# Patient Record
Sex: Female | Born: 1995 | Race: White | Hispanic: No | Marital: Single | Smoking: Never smoker
Health system: Southern US, Community
[De-identification: ages and names within clinical notes are randomized; demographics above are authoritative.]

## PROBLEM LIST (undated history)

## (undated) DIAGNOSIS — R51 Headache: Secondary | ICD-10-CM

## (undated) DIAGNOSIS — R519 Headache, unspecified: Secondary | ICD-10-CM

## (undated) DIAGNOSIS — G43909 Migraine, unspecified, not intractable, without status migrainosus: Secondary | ICD-10-CM

## (undated) HISTORY — PX: TONSILLECTOMY AND ADENOIDECTOMY: SUR1326

---
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
## 2015-05-05 IMAGING — CR DG FOOT COMPLETE 3+V*R*
3 series · 3 of 3 positions shown · non-contrast
Comparison: None.

CLINICAL DATA: Dropped heavy object across top of foot with pain

EXAM:
RIGHT FOOT COMPLETE - 3+ VIEW

[t foot ap right]
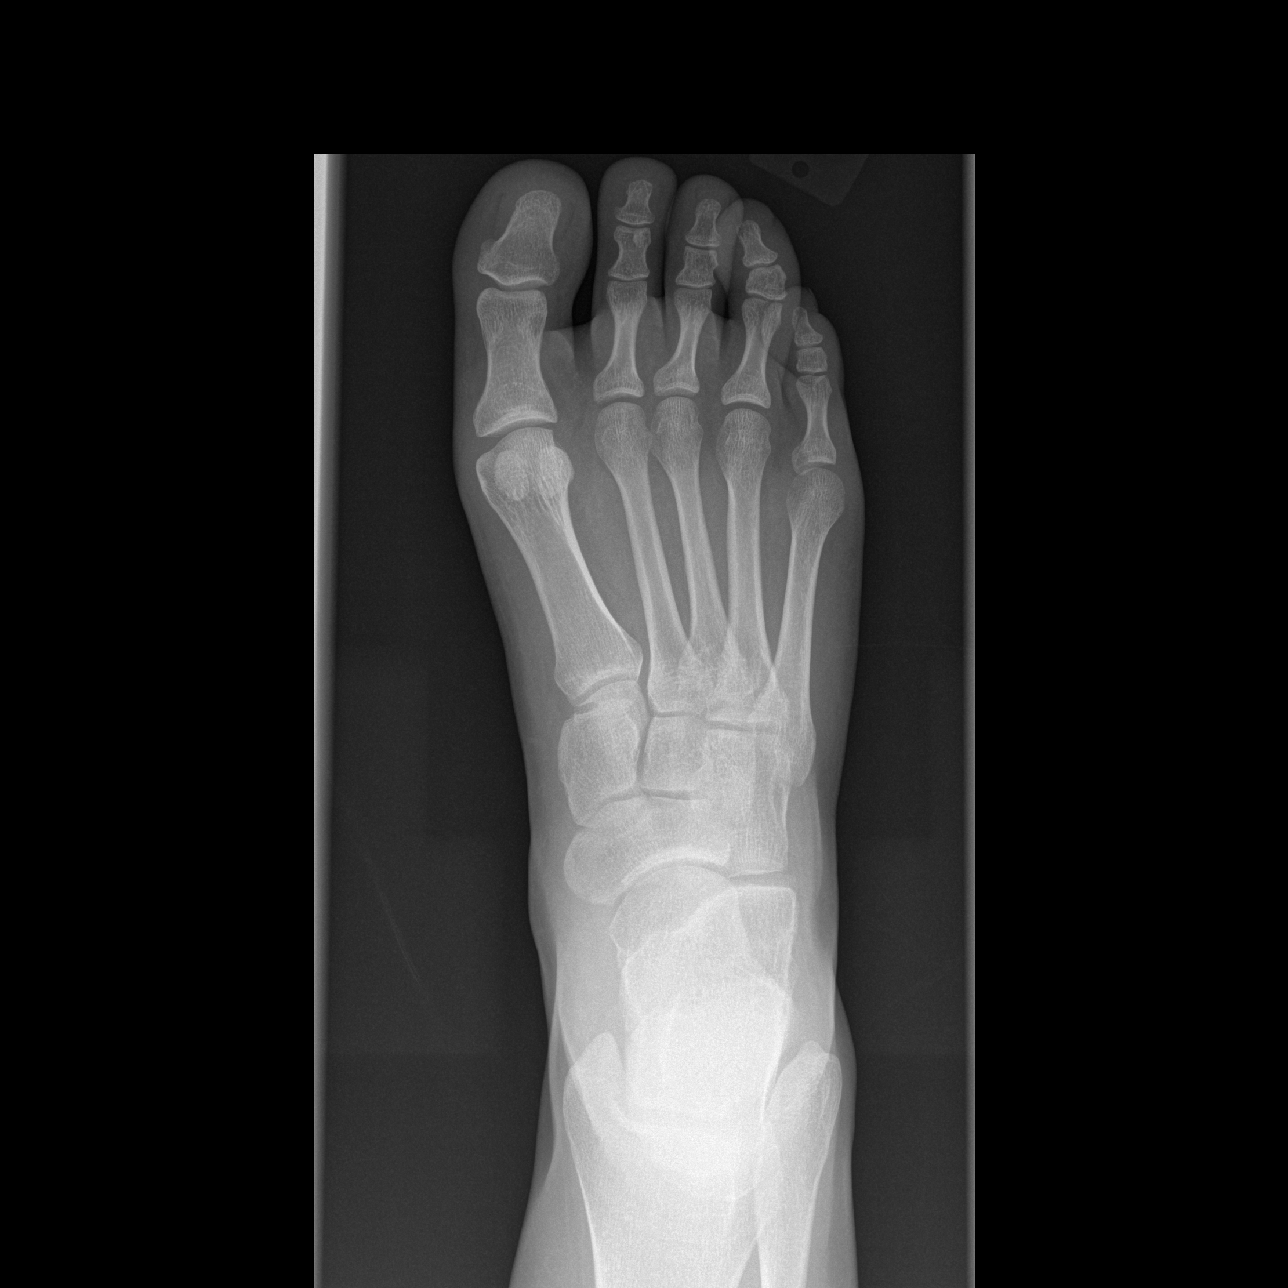

[t foot oblique right]
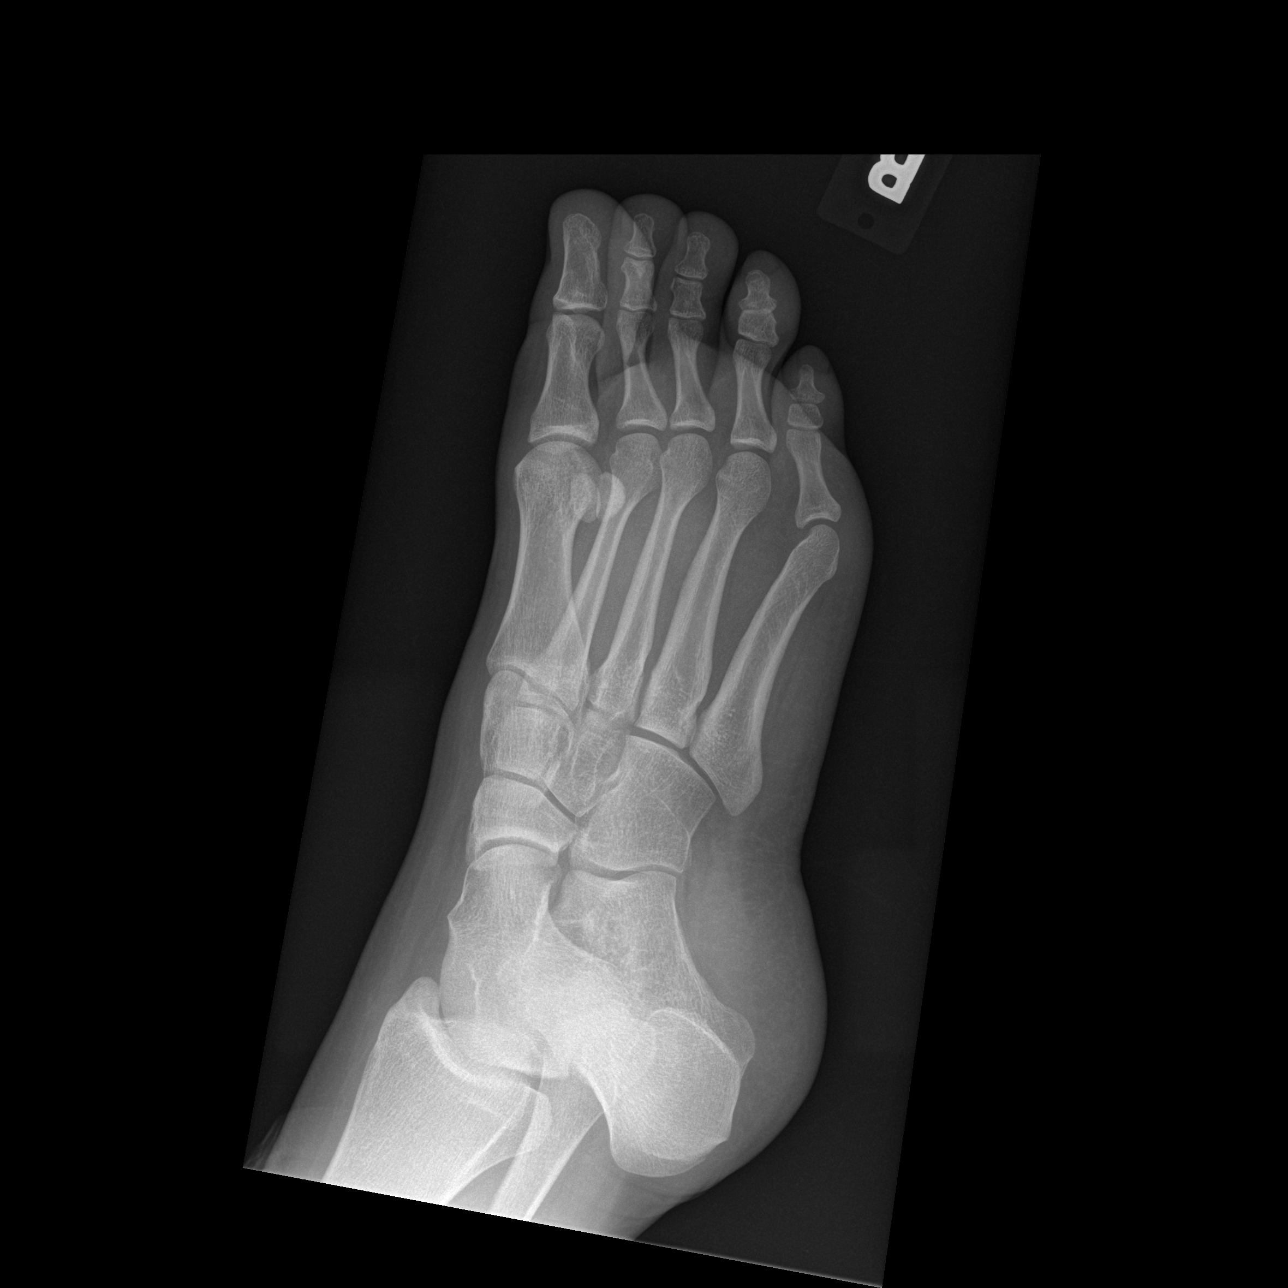

[t foot lat right]
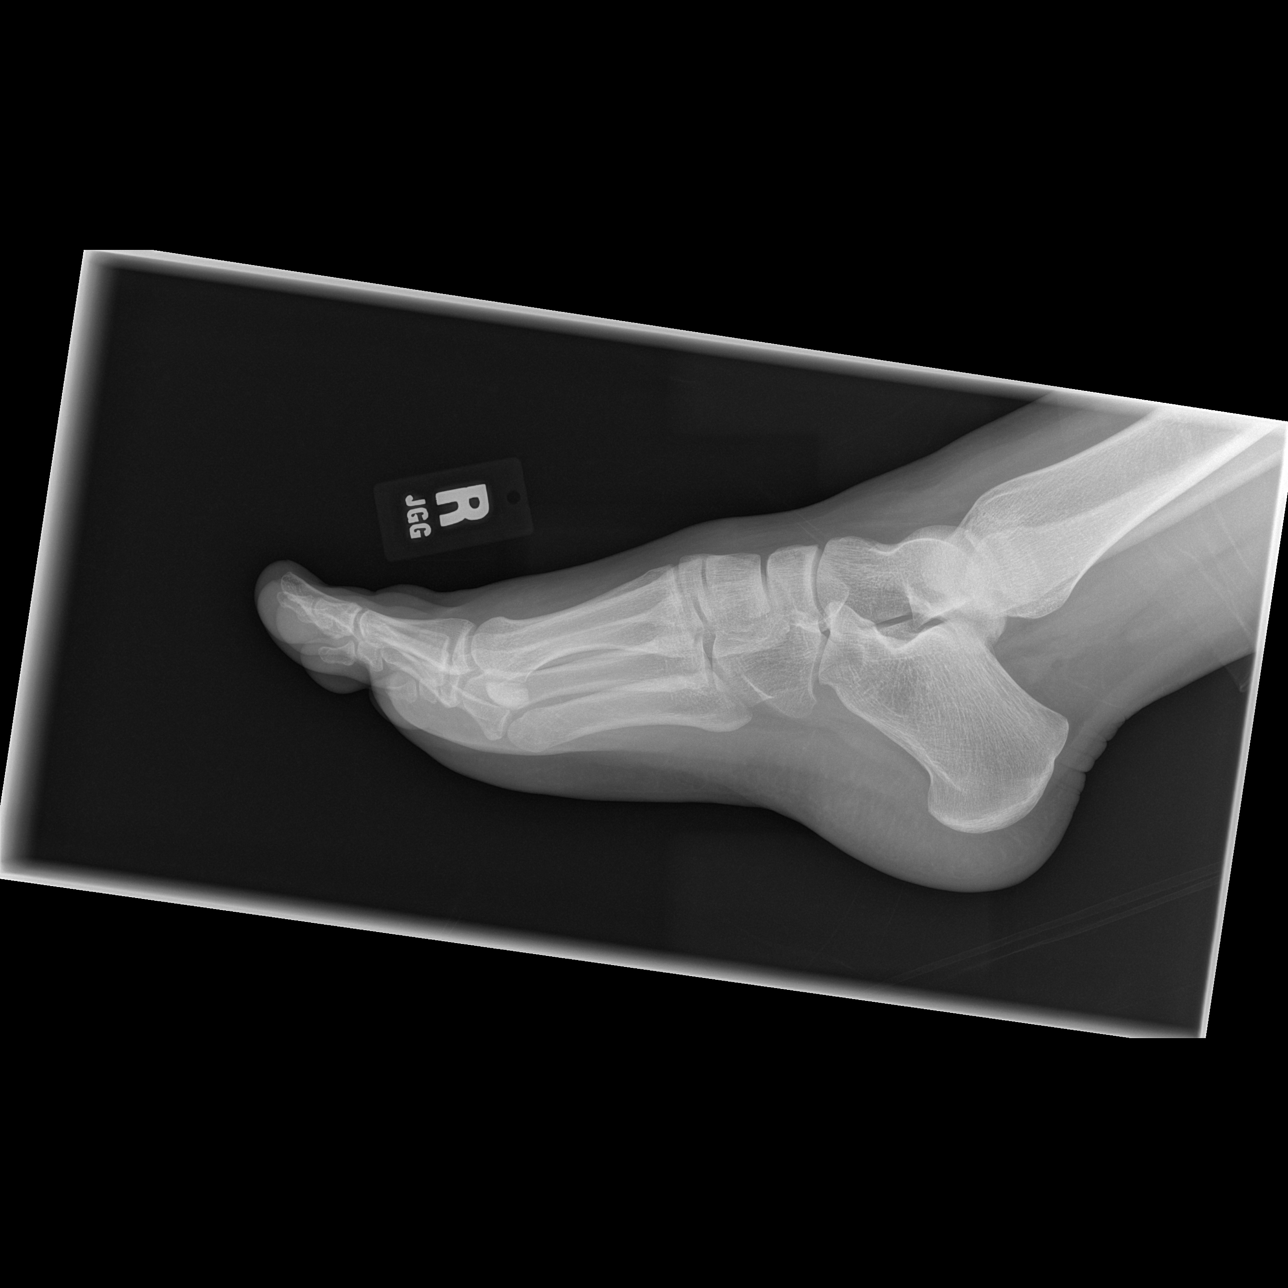

[3 of 3 positions shown; findings below may reference images not displayed]

FINDINGS: There is no evidence of fracture or dislocation. There is no
evidence of arthropathy or other focal bone abnormality. Soft
tissues are unremarkable.
IMPRESSION: Negative.

## 2016-11-01 ENCOUNTER — Emergency Department (HOSPITAL_COMMUNITY)
Admission: EM | Admit: 2016-11-01 | Discharge: 2016-11-02 | Disposition: A | Discharge status: Home / Self Care | Payer: 59 | Attending: Emergency Medicine | Admitting: Emergency Medicine

## 2016-11-01 ENCOUNTER — Encounter (HOSPITAL_COMMUNITY): Payer: Self-pay | Admitting: *Deleted

## 2016-11-01 DIAGNOSIS — S0993XA Unspecified injury of face, initial encounter: Secondary | ICD-10-CM | POA: Diagnosis present

## 2016-11-01 DIAGNOSIS — Y929 Unspecified place or not applicable: Secondary | ICD-10-CM | POA: Insufficient documentation

## 2016-11-01 DIAGNOSIS — S01452A Open bite of left cheek and temporomandibular area, initial encounter: Secondary | ICD-10-CM | POA: Insufficient documentation

## 2016-11-01 DIAGNOSIS — Z23 Encounter for immunization: Secondary | ICD-10-CM | POA: Diagnosis not present

## 2016-11-01 DIAGNOSIS — W540XXA Bitten by dog, initial encounter: Secondary | ICD-10-CM | POA: Diagnosis not present

## 2016-11-01 DIAGNOSIS — Y939 Activity, unspecified: Secondary | ICD-10-CM | POA: Diagnosis not present

## 2016-11-01 DIAGNOSIS — Y999 Unspecified external cause status: Secondary | ICD-10-CM | POA: Insufficient documentation

## 2016-11-01 MED ORDER — TETANUS-DIPHTH-ACELL PERTUSSIS 5-2.5-18.5 LF-MCG/0.5 IM SUSP
0.5000 mL | Freq: Once | INTRAMUSCULAR | Status: AC
  Administered 2016-11-01: 0.5 mL via INTRAMUSCULAR
  Filled 2016-11-01: qty 0.5

## 2016-11-01 MED ORDER — LIDOCAINE-EPINEPHRINE-TETRACAINE (LET) SOLUTION
3.0000 mL | Freq: Once | NASAL | Status: AC
  Administered 2016-11-01: 3 mL via TOPICAL
  Filled 2016-11-01: qty 3

## 2016-11-01 MED ORDER — AMOXICILLIN-POT CLAVULANATE 875-125 MG PO TABS
1.0000 | ORAL_TABLET | Freq: Once | ORAL | Status: AC
  Administered 2016-11-01: 1 via ORAL
  Filled 2016-11-01: qty 1

## 2016-11-01 NOTE — ED Provider Notes (Signed)
[facility] DEPT Provider Note   CSN: [id number] Arrival date & time: 11/01/16  2235   By signing my name below, I, Delton Prairie, attest that this documentation has been prepared under the direction and in the presence of  Debroah Baller, NP. Electronically Signed: Delton Prairie, ED Scribe. 11/01/16. 11:03 PM.   History   Chief Complaint Chief Complaint  Patient presents with  . Animal Bite    The history is provided by the patient. No language interpreter was used.  Animal Bite  Contact animal:  Dog Location:  Face Facial injury location:  L cheek Pain details:    Severity:  Mild   Timing:  Constant   Progression:  Unchanged Incident location:  Home Provoked: unprovoked   Notifications:  None Animal's rabies vaccination status:  Up to date Animal in possession: yes   Tetanus status:  Out of date Relieved by:  Nothing Worsened by:  Nothing Ineffective treatments:  None tried Associated symptoms: no fever and no numbness    HPI Comments:   Maria Taylor is a 20 y.o. female who presents to the Emergency Department with family who reports a sudden onset, wound to her left cheek associated "1/10" pain s/p a dog bite which occurred prior to arrival. Mother notes the pt screamed which could have scared the dog. Pt reports bleeding immediately after the incident which is currently controlled. Family states the dog is up to date on vaccinations. She cleaned the wound with peroxide prior to arrival. Pt denies vision changes, any other associated symptoms and modifying factors at this time. Tetanus is not up to date. No known drug allergies.     History reviewed. No pertinent past medical history.  There are no active problems to display for this patient.   Past Surgical History:  Procedure Laterality Date  . ADENOIDECTOMY    . TONSILLECTOMY      OB History    No data available       Home Medications    Prior to Admission medications   Medication Sig Start Date End  Date Taking? Authorizing Provider  amoxicillin-clavulanate (AUGMENTIN) 875-125 MG tablet Take 1 tablet by mouth 2 (two) times daily. 11/02/16   Hope Bunnie Pion, NP  HYDROcodone-acetaminophen (NORCO/VICODIN) 5-325 MG per tablet Take 1 tablet by mouth every 4 (four) hours as needed. 12/31/13   Hope Bunnie Pion, NP  Magnesium Oxide 400 MG CAPS Take 1 capsule by mouth 2 (two) times daily.    Historical Provider, MD  Multiple Vitamin (MULTIVITAMIN) capsule Take 1 capsule by mouth daily.    Historical Provider, MD    Family History No family history on file.  Social History Social History  Substance Use Topics  . Smoking status: Never Smoker  . Smokeless tobacco: Never Used  . Alcohol use No     Allergies   Patient has no known allergies.   Review of Systems Review of Systems  Constitutional: Negative for fever.  Eyes: Negative for visual disturbance.  Skin: Positive for wound.  Neurological: Negative for numbness.     Physical Exam Updated Vital Signs BP 139/88 (BP Location: Right Arm)   Pulse 119   Temp 98.4 F (36.9 C) (Oral)   Resp 14   Ht 5\' 5"  (1.651 m)   Wt 111.7 kg   LMP 10/25/2016   SpO2 100%   BMI 40.98 kg/m   Physical Exam  Constitutional: She is oriented to person, place, and time. She appears well-developed and well-nourished. No distress.  HENT:  Head:    Eyes: Conjunctivae and EOM are normal. Pupils are equal, round, and reactive to light.  No signs of injury th the left eye. Sclera is clear.   Neck: Normal range of motion. Neck supple.  Cardiovascular: Normal rate.   Pulmonary/Chest: Effort normal.  Abdominal: She exhibits no distension.  Musculoskeletal: Normal range of motion.  Neurological: She is alert and oriented to person, place, and time.  Skin: Skin is warm and dry.  Left cheek under left eye has a 1.5 cm puncture laceration. Smaller puncture areas to the face noted.   Psychiatric: She has a normal mood and affect. Her behavior is normal.    Nursing note and vitals reviewed.   ED Treatments / Results  DIAGNOSTIC STUDIES:  Oxygen Saturation is 100% on RA, normal by my interpretation.    COORDINATION OF CARE:  10:58 PM Discussed treatment plan with pt at bedside and pt agreed to plan. Dr. Kathrynn Humble in to examine the patient and discuss plan of care Labs (all labs ordered are listed, but only abnormal results are displayed) Labs Reviewed - No data to display  Radiology No results found.  Procedures .Marland KitchenLaceration Repair Date/Time: 11/02/2016 12:02 AM Performed by: Ashley Murrain Authorized by: Ashley Murrain   Consent:    Consent obtained:  Verbal   Consent given by:  Patient   Risks discussed:  Infection   Alternatives discussed:  No treatment Anesthesia (see MAR for exact dosages):    Anesthesia method:  Topical application   Topical anesthetic:  LET Laceration details:    Location:  Face   Face location:  L cheek   Length (cm):  1.5 Exploration:    Hemostasis achieved with:  LET   Contaminated: no   Treatment:    Area cleansed with:  Saline   Amount of cleaning:  Standard   Irrigation solution:  Sterile saline   Irrigation method:  Syringe   Visualized foreign bodies/material removed: no   Skin repair:    Repair method:  Tissue adhesive (Dermabond) Approximation:    Approximation:  Close   Vermilion border: well-aligned   Post-procedure details:    Patient tolerance of procedure:  Tolerated well, no immediate complications    (including critical care time)  Medications Ordered in ED Medications  amoxicillin-clavulanate (AUGMENTIN) 875-125 MG per tablet 1 tablet (1 tablet Oral Given 11/01/16 2308)  Tdap (BOOSTRIX) injection 0.5 mL (0.5 mLs Intramuscular Given 11/01/16 2309)  lidocaine-EPINEPHrine-tetracaine (LET) solution (3 mLs Topical Given 11/01/16 2334)     Initial Impression / Assessment and Plan / ED Course  I have reviewed the triage vital signs and the nursing notes.  Clinical  Course     Tetanus updated in ED. Puncture laceration occurred < 12 hours prior to arrival. Discussed laceration care with pt and answered questions. Pt to f-u for wound check should there be signs of dehiscence or infection. Pt is hemodynamically stable with no complaints prior to dc.     Final Clinical Impressions(s) / ED Diagnoses   Final diagnoses:  Dog bite, initial encounter    New Prescriptions New Prescriptions   AMOXICILLIN-CLAVULANATE (AUGMENTIN) 875-125 MG TABLET    Take 1 tablet by mouth 2 (two) times daily.  I personally performed the services described in this documentation, which was scribed in my presence. The recorded information has been reviewed and is accurate.     [place], NP 11/02/16 [place], MD 11/02/16 [phone]

## 2016-11-01 NOTE — ED Triage Notes (Signed)
The pt has puncture wounds to the lt side of her face  She was bitten by her cocker spaniel just pta  No active bleeding.  The dog has had his shots  lmp last week

## 2016-11-02 MED ORDER — AMOXICILLIN-POT CLAVULANATE 875-125 MG PO TABS: 1.0000 | ORAL_TABLET | Freq: Two times a day (BID) | ORAL | 0 refills | Status: DC

## 2016-11-02 NOTE — Discharge Instructions (Signed)
Take the antibiotic as directed. Follow up with your doctor or return here for worsening symptoms.

## 2016-11-23 ENCOUNTER — Other Ambulatory Visit: Payer: Self-pay | Admitting: Otolaryngology

## 2016-12-04 ENCOUNTER — Encounter (HOSPITAL_COMMUNITY)
Admission: RE | Admit: 2016-12-04 | Discharge: 2016-12-04 | Disposition: A | Discharge status: Home / Self Care | Payer: 59 | Source: Ambulatory Visit | Attending: Otolaryngology | Admitting: Otolaryngology

## 2016-12-04 ENCOUNTER — Encounter (HOSPITAL_COMMUNITY)
Admission: RE | Admit: 2016-12-04 | Discharge: 2016-12-04 | Disposition: A | Discharge status: Home / Self Care | Payer: 59 | Source: Ambulatory Visit | Attending: Anesthesiology | Admitting: Anesthesiology

## 2016-12-04 ENCOUNTER — Encounter (HOSPITAL_COMMUNITY): Payer: Self-pay

## 2016-12-04 DIAGNOSIS — Z01818 Encounter for other preprocedural examination: Secondary | ICD-10-CM | POA: Insufficient documentation

## 2016-12-04 DIAGNOSIS — Z01812 Encounter for preprocedural laboratory examination: Secondary | ICD-10-CM | POA: Diagnosis not present

## 2016-12-04 DIAGNOSIS — E079 Disorder of thyroid, unspecified: Secondary | ICD-10-CM | POA: Diagnosis not present

## 2016-12-04 HISTORY — DX: Headache, unspecified: R51.9

## 2016-12-04 HISTORY — DX: Headache: R51

## 2016-12-04 LAB — CBC
HEMATOCRIT: 41.8 % (ref 36.0–46.0)
HEMOGLOBIN: 13.9 g/dL (ref 12.0–15.0)
MCH: 27.6 pg (ref 26.0–34.0)
MCHC: 33.3 g/dL (ref 30.0–36.0)
MCV: 83.1 fL (ref 78.0–100.0)
Platelets: 324 10*3/uL (ref 150–400)
RBC: 5.03 MIL/uL (ref 3.87–5.11)
RDW: 13.2 % (ref 11.5–15.5)
WBC: 10.1 10*3/uL (ref 4.0–10.5)

## 2016-12-04 LAB — HCG, SERUM, QUALITATIVE: Preg, Serum: NEGATIVE

## 2016-12-04 NOTE — Progress Notes (Signed)
PCP Dr. Caryl Comes. Denies any shortness of breath or chest pain at PAT appt.  Has never seen a cardiologist or had any cardiac testing.

## 2016-12-04 NOTE — Pre-Procedure Instructions (Signed)
Maria Taylor  12/04/2016      [facility] [phone] - [place] - [address] AT [address] [place] [address] Phone: [phone] Fax: [phone]  [facility] - [place], [place] [address] Phone: [phone] Fax: [phone]    Your procedure is scheduled on December 11, 2016.  Report to [facility] Admitting at [place].  Call this number if you have problems the morning of surgery:  [phone]   Remember:  Do not eat food or drink liquids after midnight.  Take these medicines the morning of surgery with A SIP OF WATER Maria Taylor   7 days prior to surgery STOP taking any Aspirin, Aleve, Naproxen, Ibuprofen, Motrin, Advil, Goody's, BC's, all herbal medications, fish oil, and  all vitamins   Do not wear jewelry, make-up or nail polish.  Do not wear lotions, powders, or perfumes, or deoderant.  Do not shave 48 hours prior to surgery.  Do not bring valuables to the hospital.  [facility] is not responsible for any belongings or valuables.  Contacts, dentures or bridgework may not be worn into surgery.  Leave your suitcase in the car.  After surgery it may be brought to your room.  For patients admitted to the hospital, discharge time will be determined by your treatment team.  Patients discharged the day of surgery will not be allowed to drive home.   Name and phone number of your driver:   Special instructions:   [place]- Preparing For Surgery  Before surgery, you can play an important role. Because skin is not sterile, your skin needs to be as free of germs as possible. You can reduce the number of germs on your skin by washing with CHG (chlorahexidine gluconate) Soap before surgery.  CHG is an antiseptic cleaner which kills germs and bonds with the skin to continue killing germs even after washing.  Please do not use if you have an  allergy to CHG or antibacterial soaps. If your skin becomes reddened/irritated stop using the CHG.  Do not shave (including legs and underarms) for at least 48 hours prior to first CHG shower. It is OK to shave your face.  Please follow these instructions carefully.   1. Shower the NIGHT BEFORE SURGERY and the MORNING OF SURGERY with CHG.   2. If you chose to wash your hair, wash your hair first as usual with your normal shampoo.  3. After you shampoo, rinse your hair and body thoroughly to remove the shampoo.  4. Use CHG as you would any other liquid soap. You can apply CHG directly to the skin and wash gently with a scrungie or a clean washcloth.   5. Apply the CHG Soap to your body ONLY FROM THE NECK DOWN.  Do not use on open wounds or open sores. Avoid contact with your eyes, ears, mouth and genitals (private parts). Wash genitals (private parts) with your normal soap.  6. Wash thoroughly, paying special attention to the area where your surgery will be performed.  7. Thoroughly rinse your body with warm water from the neck down.  8. DO NOT shower/wash with your normal soap after using and rinsing off the CHG Soap.  9. Pat yourself dry with a CLEAN TOWEL.   10. Wear CLEAN PAJAMAS   11. Place CLEAN SHEETS on your bed the  night of your first shower and DO NOT SLEEP WITH PETS.    Day of Surgery: Do not apply any deodorants/lotions. Please wear clean clothes to the hospital/surgery center.      Please read over the following fact sheets that you were given. Pain Booklet, MRSA Information and Surgical Site Infection Prevention

## 2016-12-11 ENCOUNTER — Encounter (HOSPITAL_COMMUNITY): Payer: Self-pay | Admitting: Otolaryngology

## 2016-12-11 ENCOUNTER — Encounter (HOSPITAL_COMMUNITY)
Admission: RE | Disposition: A | Discharge status: Home / Self Care | Payer: Self-pay | Source: Ambulatory Visit | Attending: Otolaryngology

## 2016-12-11 ENCOUNTER — Observation Stay (HOSPITAL_COMMUNITY)
Admission: RE | Admit: 2016-12-11 | Discharge: 2016-12-12 | Disposition: A | Discharge status: Home / Self Care | Payer: 59 | Source: Ambulatory Visit | Attending: Otolaryngology | Admitting: Otolaryngology

## 2016-12-11 ENCOUNTER — Ambulatory Visit (HOSPITAL_COMMUNITY): Payer: 59 | Admitting: Anesthesiology

## 2016-12-11 DIAGNOSIS — D34 Benign neoplasm of thyroid gland: Principal | ICD-10-CM | POA: Insufficient documentation

## 2016-12-11 DIAGNOSIS — Z793 Long term (current) use of hormonal contraceptives: Secondary | ICD-10-CM | POA: Insufficient documentation

## 2016-12-11 DIAGNOSIS — E079 Disorder of thyroid, unspecified: Secondary | ICD-10-CM | POA: Diagnosis present

## 2016-12-11 HISTORY — PX: THYROIDECTOMY: SHX17

## 2016-12-11 HISTORY — DX: Migraine, unspecified, not intractable, without status migrainosus: G43.909

## 2016-12-11 HISTORY — PX: THYROIDECTOMY, PARTIAL: SHX18

## 2016-12-11 LAB — CBC
HEMATOCRIT: 40 % (ref 36.0–46.0)
HEMOGLOBIN: 13.5 g/dL (ref 12.0–15.0)
MCH: 27.7 pg (ref 26.0–34.0)
MCHC: 33.8 g/dL (ref 30.0–36.0)
MCV: 82.1 fL (ref 78.0–100.0)
Platelets: 278 10*3/uL (ref 150–400)
RBC: 4.87 MIL/uL (ref 3.87–5.11)
RDW: 13.3 % (ref 11.5–15.5)
WBC: 12.2 10*3/uL — ABNORMAL HIGH (ref 4.0–10.5)

## 2016-12-11 LAB — CREATININE, SERUM
Creatinine, Ser: 0.88 mg/dL (ref 0.44–1.00)
GFR calc Af Amer: >60 mL/min (ref >60)

## 2016-12-11 SURGERY — THYROIDECTOMY
Anesthesia: General | Laterality: Left

## 2016-12-11 MED ORDER — CHLORHEXIDINE GLUCONATE CLOTH 2 % EX PADS: 6.0000 | MEDICATED_PAD | Freq: Once | CUTANEOUS | Status: DC

## 2016-12-11 MED ORDER — HEPARIN SODIUM (PORCINE) 5000 UNIT/ML IJ SOLN
5000.0000 [IU] | Freq: Three times a day (TID) | INTRAMUSCULAR | Status: DC
  Administered 2016-12-11 – 2016-12-12 (×2): 5000 [IU] via SUBCUTANEOUS
  Filled 2016-12-11 (×2): qty 1

## 2016-12-11 MED ORDER — FENTANYL CITRATE (PF) 100 MCG/2ML IJ SOLN
INTRAMUSCULAR | Status: AC
  Filled 2016-12-11: qty 4

## 2016-12-11 MED ORDER — FENTANYL CITRATE (PF) 100 MCG/2ML IJ SOLN
INTRAMUSCULAR | Status: AC
Start: 2016-12-11 — End: 2016-12-11
  Filled 2016-12-11: qty 2

## 2016-12-11 MED ORDER — DEXAMETHASONE SODIUM PHOSPHATE 10 MG/ML IJ SOLN
INTRAMUSCULAR | Status: AC
  Filled 2016-12-11: qty 1

## 2016-12-11 MED ORDER — HEMOSTATIC AGENTS (NO CHARGE) OPTIME
TOPICAL | Status: DC | PRN
  Administered 2016-12-11: 1 via TOPICAL

## 2016-12-11 MED ORDER — DEXTROSE IN LACTATED RINGERS 5 % IV SOLN
INTRAVENOUS | Status: DC
  Administered 2016-12-11 – 2016-12-12 (×2): via INTRAVENOUS

## 2016-12-11 MED ORDER — FENTANYL CITRATE (PF) 100 MCG/2ML IJ SOLN
INTRAMUSCULAR | Status: DC | PRN
  Administered 2016-12-11: 100 ug via INTRAVENOUS
  Administered 2016-12-11 (×4): 50 ug via INTRAVENOUS

## 2016-12-11 MED ORDER — ZOLPIDEM TARTRATE 5 MG PO TABS: 5.0000 mg | ORAL_TABLET | Freq: Every evening | ORAL | Status: DC | PRN

## 2016-12-11 MED ORDER — LACTATED RINGERS IV SOLN
INTRAVENOUS | Status: DC
  Administered 2016-12-11 (×3): via INTRAVENOUS

## 2016-12-11 MED ORDER — LIDOCAINE-EPINEPHRINE 1 %-1:100000 IJ SOLN
INTRAMUSCULAR | Status: DC | PRN
  Administered 2016-12-11: 2 mL

## 2016-12-11 MED ORDER — DEXAMETHASONE SODIUM PHOSPHATE 10 MG/ML IJ SOLN
10.0000 mg | Freq: Once | INTRAMUSCULAR | Status: AC
  Administered 2016-12-11: 10 mg via INTRAVENOUS
  Filled 2016-12-11: qty 1

## 2016-12-11 MED ORDER — SUCCINYLCHOLINE CHLORIDE 200 MG/10ML IV SOSY
PREFILLED_SYRINGE | INTRAVENOUS | Status: AC
  Filled 2016-12-11: qty 10

## 2016-12-11 MED ORDER — HYDROCODONE-ACETAMINOPHEN 5-325 MG PO TABS: 1.0000 | ORAL_TABLET | Freq: Four times a day (QID) | ORAL | 0 refills | Status: AC | PRN

## 2016-12-11 MED ORDER — ONDANSETRON HCL 4 MG/2ML IJ SOLN: 4.0000 mg | Freq: Once | INTRAMUSCULAR | Status: DC | PRN

## 2016-12-11 MED ORDER — ONDANSETRON HCL 4 MG/2ML IJ SOLN
INTRAMUSCULAR | Status: DC | PRN
  Administered 2016-12-11: 4 mg via INTRAVENOUS

## 2016-12-11 MED ORDER — PROPOFOL 10 MG/ML IV BOLUS
INTRAVENOUS | Status: AC
  Filled 2016-12-11: qty 20

## 2016-12-11 MED ORDER — PHENYLEPHRINE HCL 10 MG/ML IJ SOLN
INTRAMUSCULAR | Status: DC | PRN
  Administered 2016-12-11 (×5): 80 ug via INTRAVENOUS

## 2016-12-11 MED ORDER — LIDOCAINE HCL (CARDIAC) 20 MG/ML IV SOLN
INTRAVENOUS | Status: DC | PRN
  Administered 2016-12-11: 40 mg via INTRAVENOUS

## 2016-12-11 MED ORDER — ONDANSETRON HCL 4 MG/2ML IJ SOLN
INTRAMUSCULAR | Status: AC
  Filled 2016-12-11: qty 2

## 2016-12-11 MED ORDER — OXYCODONE HCL 5 MG/5ML PO SOLN: 5.0000 mg | Freq: Once | ORAL | Status: DC | PRN

## 2016-12-11 MED ORDER — HYDROCODONE-ACETAMINOPHEN 5-325 MG PO TABS
1.0000 | ORAL_TABLET | ORAL | Status: DC | PRN
Start: 2016-12-11 — End: 2016-12-12

## 2016-12-11 MED ORDER — CEFAZOLIN SODIUM-DEXTROSE 2-4 GM/100ML-% IV SOLN
2.0000 g | INTRAVENOUS | Status: AC
  Administered 2016-12-11: 2 g via INTRAVENOUS
  Filled 2016-12-11: qty 100

## 2016-12-11 MED ORDER — ONDANSETRON HCL 4 MG/2ML IJ SOLN: 4.0000 mg | INTRAMUSCULAR | Status: DC | PRN

## 2016-12-11 MED ORDER — MIDAZOLAM HCL 2 MG/2ML IJ SOLN
INTRAMUSCULAR | Status: AC
Start: 2016-12-11 — End: 2016-12-11
  Filled 2016-12-11: qty 2

## 2016-12-11 MED ORDER — FENTANYL CITRATE (PF) 100 MCG/2ML IJ SOLN: 25.0000 ug | INTRAMUSCULAR | Status: DC | PRN

## 2016-12-11 MED ORDER — ONDANSETRON HCL 4 MG PO TABS: 4.0000 mg | ORAL_TABLET | ORAL | Status: DC | PRN

## 2016-12-11 MED ORDER — LIDOCAINE 2% (20 MG/ML) 5 ML SYRINGE
INTRAMUSCULAR | Status: AC
  Filled 2016-12-11: qty 5

## 2016-12-11 MED ORDER — MIDAZOLAM HCL 5 MG/5ML IJ SOLN
INTRAMUSCULAR | Status: DC | PRN
  Administered 2016-12-11: 2 mg via INTRAVENOUS

## 2016-12-11 MED ORDER — PHENYLEPHRINE 40 MCG/ML (10ML) SYRINGE FOR IV PUSH (FOR BLOOD PRESSURE SUPPORT)
PREFILLED_SYRINGE | INTRAVENOUS | Status: AC
  Filled 2016-12-11: qty 20

## 2016-12-11 MED ORDER — OXYCODONE HCL 5 MG PO TABS: 5.0000 mg | ORAL_TABLET | Freq: Once | ORAL | Status: DC | PRN

## 2016-12-11 MED ORDER — PROPOFOL 10 MG/ML IV BOLUS
INTRAVENOUS | Status: DC | PRN
  Administered 2016-12-11: 160 mg via INTRAVENOUS

## 2016-12-11 MED ORDER — SUCCINYLCHOLINE CHLORIDE 20 MG/ML IJ SOLN
INTRAMUSCULAR | Status: DC | PRN
  Administered 2016-12-11: 160 mg via INTRAVENOUS

## 2016-12-11 SURGICAL SUPPLY — 58 items
ADH SKN CLS APL DERMABOND .7 (GAUZE/BANDAGES/DRESSINGS) ×1
APPLIER CLIP 9.375 SM OPEN (CLIP)
APR CLP SM 9.3 20 MLT OPN (CLIP)
ATTRACTOMAT 16X20 MAGNETIC DRP (DRAPES) ×4 IMPLANT
BLADE SURG 15 STRL LF DISP TIS (BLADE) IMPLANT
BLADE SURG 15 STRL SS (BLADE)
CANISTER SUCTION 2500CC (MISCELLANEOUS) ×3 IMPLANT
CLEANER TIP ELECTROSURG 2X2 (MISCELLANEOUS) ×3 IMPLANT
CLIP APPLIE 9.375 SM OPEN (CLIP) IMPLANT
CORDS BIPOLAR (ELECTRODE) ×3 IMPLANT
COVER SURGICAL LIGHT HANDLE (MISCELLANEOUS) ×3 IMPLANT
DECANTER SPIKE VIAL GLASS SM (MISCELLANEOUS) ×2 IMPLANT
DERMABOND ADVANCED (GAUZE/BANDAGES/DRESSINGS) ×2
DERMABOND ADVANCED .7 DNX12 (GAUZE/BANDAGES/DRESSINGS) ×1 IMPLANT
DRAIN HEMOVAC 7FR (DRAIN) ×2 IMPLANT
DRAIN SNY 10 ROU (WOUND CARE) IMPLANT
DRAPE PROXIMA HALF (DRAPES) ×2 IMPLANT
ELECT COATED BLADE 2.86 ST (ELECTRODE) ×3 IMPLANT
ELECT REM PT RETURN 9FT ADLT (ELECTROSURGICAL) ×3
ELECTRODE REM PT RTRN 9FT ADLT (ELECTROSURGICAL) ×1 IMPLANT
EVACUATOR SILICONE 100CC (DRAIN) ×3 IMPLANT
FORCEPS BIPOLAR SPETZLER 8 1.0 (NEUROSURGERY SUPPLIES) ×2 IMPLANT
GAUZE SPONGE 4X4 16PLY XRAY LF (GAUZE/BANDAGES/DRESSINGS) ×3 IMPLANT
GLOVE BIO SURGEON STRL SZ 6.5 (GLOVE) ×1 IMPLANT
GLOVE BIO SURGEON STRL SZ7.5 (GLOVE) ×2 IMPLANT
GLOVE BIO SURGEONS STRL SZ 6.5 (GLOVE) ×1
GLOVE BIOGEL M 7.0 STRL (GLOVE) ×7 IMPLANT
GLOVE BIOGEL PI IND STRL 6.5 (GLOVE) IMPLANT
GLOVE BIOGEL PI INDICATOR 6.5 (GLOVE) ×6
GOWN STRL REUS W/ TWL LRG LVL3 (GOWN DISPOSABLE) ×3 IMPLANT
GOWN STRL REUS W/TWL LRG LVL3 (GOWN DISPOSABLE) ×15
HEMOSTAT SURGICEL 2X14 (HEMOSTASIS) ×2 IMPLANT
KIT BASIN OR (CUSTOM PROCEDURE TRAY) ×3 IMPLANT
KIT ROOM TURNOVER OR (KITS) ×3 IMPLANT
MARKER SKIN DUAL TIP RULER LAB (MISCELLANEOUS) ×2 IMPLANT
NDL HYPO 25GX1X1/2 BEV (NEEDLE) IMPLANT
NDL PRECISIONGLIDE 27X1.5 (NEEDLE) ×1 IMPLANT
NEEDLE HYPO 25GX1X1/2 BEV (NEEDLE) ×3 IMPLANT
NEEDLE PRECISIONGLIDE 27X1.5 (NEEDLE) ×3 IMPLANT
NS IRRIG 1000ML POUR BTL (IV SOLUTION) ×3 IMPLANT
PAD ARMBOARD 7.5X6 YLW CONV (MISCELLANEOUS) ×4 IMPLANT
PENCIL BUTTON HOLSTER BLD 10FT (ELECTRODE) ×3 IMPLANT
SHEARS HARMONIC 9CM CVD (BLADE) ×3 IMPLANT
SPONGE INTESTINAL PEANUT (DISPOSABLE) ×3 IMPLANT
STAPLER VISISTAT 35W (STAPLE) ×3 IMPLANT
SUT CHROMIC 2 0 SH (SUTURE) IMPLANT
SUT ETHILON 3 0 PS 1 (SUTURE) ×3 IMPLANT
SUT ETHILON 5 0 PS 2 18 (SUTURE) IMPLANT
SUT SILK 2 0 REEL (SUTURE) IMPLANT
SUT SILK 2 0 SH CR/8 (SUTURE) ×3 IMPLANT
SUT SILK 3 0 REEL (SUTURE) ×3 IMPLANT
SUT SILK 4 0 REEL (SUTURE) IMPLANT
SUT VIC AB 3-0 SH 27 (SUTURE) ×3
SUT VIC AB 3-0 SH 27X BRD (SUTURE) ×1 IMPLANT
SUT VICRYL 4-0 PS2 18IN ABS (SUTURE) IMPLANT
TOWEL OR 17X24 6PK STRL BLUE (TOWEL DISPOSABLE) ×1 IMPLANT
TRAY ENT MC OR (CUSTOM PROCEDURE TRAY) ×3 IMPLANT
TUBE ENDOTRAC EMG 7X10.2 (MISCELLANEOUS) ×2 IMPLANT

## 2016-12-11 NOTE — Anesthesia Preprocedure Evaluation (Signed)
Anesthesia Evaluation  Patient identified by MRN, date of birth, ID band Patient awake    Reviewed: Allergy & Precautions, NPO status , Patient's Chart, lab work & pertinent test results  Airway Mallampati: II  TM Distance: >3 FB     Dental  (+) Teeth Intact, Dental Advisory Given   Pulmonary    breath sounds clear to auscultation       Cardiovascular  Rhythm:Regular Rate:Normal     Neuro/Psych    GI/Hepatic   Endo/Other    Renal/GU      Musculoskeletal   Abdominal   Peds  Hematology   Anesthesia Other Findings   Reproductive/Obstetrics                             Anesthesia Physical Anesthesia Plan  ASA: II  Anesthesia Plan: General   Post-op Pain Management:    Induction: Intravenous  Airway Management Planned: Oral ETT  Additional Equipment:   Intra-op Plan:   Post-operative Plan: Extubation in OR  Informed Consent: I have reviewed the patients History and Physical, chart, labs and discussed the procedure including the risks, benefits and alternatives for the proposed anesthesia with the patient or authorized representative who has indicated his/her understanding and acceptance.   Dental advisory given  Plan Discussed with: CRNA and Anesthesiologist  Anesthesia Plan Comments:         Anesthesia Quick Evaluation

## 2016-12-11 NOTE — Anesthesia Postprocedure Evaluation (Signed)
Anesthesia Post Note  Patient: Maria Taylor  Procedure(s) Performed: Procedure(s) (LRB): LEFT HEMI-TYROIDECTOMY (Left)  Patient location during evaluation: PACU Anesthesia Type: General Level of consciousness: awake, awake and alert and oriented Pain management: pain level controlled Vital Signs Assessment: post-procedure vital signs reviewed and stable Respiratory status: spontaneous breathing Cardiovascular status: blood pressure returned to baseline Anesthetic complications: no       Last Vitals:  Vitals:   12/11/16 1315 12/11/16 1330  BP: (!) 107/49 (!) 106/55  Pulse: (!) 122 (!) 114  Resp: 20 18  Temp:      Last Pain:  Vitals:   12/11/16 1315  TempSrc:   PainSc: Asleep                 Tamyka Bezio COKER

## 2016-12-11 NOTE — Progress Notes (Signed)
   ENT Progress Note: s/p Procedure(s): LEFT HEMI-TYROIDECTOMY   Subjective: Stable no pain   Objective: Vital signs in last 24 hours: Temp:  [97.7 F (36.5 C)-98.6 F (37 C)] 98 F (36.7 C) (12/29 1546) Pulse Rate:  [93-122] 104 (12/29 1546) Resp:  [15-25] 23 (12/29 1500) BP: (106-129)/(49-80) 108/66 (12/29 1546) SpO2:  [97 %-99 %] 98 % (12/29 1546) Weight:  [113.1 kg (249 lb 6.4 oz)] 113.1 kg (249 lb 6.4 oz) (12/29 0928) Weight change:  Last BM Date: 12/10/16  Intake/Output from previous day: No intake/output data recorded. Intake/Output this shift: Total I/O In: 1646.3 [I.V.:1646.3] Out: 31 [Drains:11; Blood:20]  Labs:  Recent Labs  12/11/16 1606  WBC 12.2*  HGB 13.5  HCT 40.0  PLT 278   No results for input(s): NA, K, CL, CO2, GLUCOSE, BUN, CALCIUM in the last 72 hours.  Invalid input(s): CREATININR  Studies/Results: No results found.   PHYSICAL EXAM: Inc intact Min d/c  Voice and airway stable   Assessment/Plan: Stable postop Monitor drain Plan d/c in am    Rogen Porte 12/11/2016, 6:16 PM

## 2016-12-11 NOTE — Transfer of Care (Signed)
Immediate Anesthesia Transfer of Care Note  Patient: Maria Taylor  Procedure(s) Performed: Procedure(s): LEFT HEMI-TYROIDECTOMY (Left)  Patient Location: PACU  Anesthesia Type:General  Level of Consciousness: awake, alert , oriented and patient cooperative  Airway & Oxygen Therapy: Patient Spontanous Breathing and Patient connected to nasal cannula oxygen  Post-op Assessment: Report given to RN and Post -op Vital signs reviewed and stable  Post vital signs: Reviewed and stable  Last Vitals:  Vitals:   12/11/16 0907 12/11/16 1300  BP: 129/80 (!) (P) 108/53  Pulse: 95 (!) (P) 115  Resp: 20 (P) 15  Temp: 37 C (P) 37 C    Last Pain:  Vitals:   12/11/16 0907  TempSrc: Oral         Complications: No apparent anesthesia complications

## 2016-12-11 NOTE — H&P (Signed)
Maria Taylor is an 20 y.o. female.   Chief Complaint: Left thyroid mass HPI: pt with asymptomatic Left thyroid mass. Bx suspicious for possible thyroid cancer  Past Medical History:  Diagnosis Date  . Headache    migraines    Past Surgical History:  Procedure Laterality Date  . ADENOIDECTOMY    . TONSILLECTOMY      No family history on file. Social History:  reports that she has never smoked. She has never used smokeless tobacco. She reports that she does not drink alcohol or use drugs.  Allergies:  Allergies  Allergen Reactions  . No Known Allergies     No prescriptions prior to admission.    No results found for this or any previous visit (from the past 48 hour(s)). No results found.  Review of Systems  Constitutional: Negative.   HENT: Negative.   Respiratory: Negative.   Cardiovascular: Negative.     Last menstrual period 11/20/2016. Physical Exam  Constitutional: She is oriented to person, place, and time. She appears well-developed and well-nourished.  Neck:  Left thyroid mass  Cardiovascular: Normal rate.   Respiratory: Effort normal.  GI: Soft.  Musculoskeletal: Normal range of motion.  Neurological: She is alert and oriented to person, place, and time.     Assessment/Plan Adm for Left hemithyroidectomy and possible total.  Taura Lamarre, MD 12/11/2016, 8:06 AM

## 2016-12-11 NOTE — Brief Op Note (Signed)
12/11/2016  12:59 PM  PATIENT:  Maria Taylor  20 y.o. female  PRE-OPERATIVE DIAGNOSIS:  Left Thyroid Mass  POST-OPERATIVE DIAGNOSIS:  Left Thyroid Mass  PROCEDURE:  Procedure(s): LEFT HEMI-TYROIDECTOMY (Left)  SURGEON:  Surgeon(s) and Role:    * Jerrell Belfast, MD - Primary  PHYSICIAN ASSISTANT: Etta Grandchild, PA  ASSISTANTS: none   ANESTHESIA:   general  EBL:  Total I/O In: 1000 [I.V.:1000] Out: - <50cc  BLOOD ADMINISTERED:none  DRAINS: (7 Fr) Jackson-Pratt drain(s) with closed bulb suction in the anterior neck   LOCAL MEDICATIONS USED:  LIDOCAINE  and Amount: 2 ml  SPECIMEN:  Source of Specimen:  Left thyroid lobe  DISPOSITION OF SPECIMEN:  PATHOLOGY  COUNTS:  YES  TOURNIQUET:  * No tourniquets in log *  DICTATION: .Other Dictation: Dictation Number [id number]  PLAN OF CARE: Admit to inpatient   PATIENT DISPOSITION:  PACU - hemodynamically stable.   Delay start of Pharmacological VTE agent (>24hrs) due to surgical blood loss or risk of bleeding: no

## 2016-12-12 ENCOUNTER — Encounter (HOSPITAL_COMMUNITY): Payer: Self-pay | Admitting: Otolaryngology

## 2016-12-12 DIAGNOSIS — D34 Benign neoplasm of thyroid gland: Secondary | ICD-10-CM | POA: Diagnosis not present

## 2016-12-12 NOTE — Discharge Summary (Signed)
Physician Discharge Summary  Patient ID: Maria Taylor MRN: [id number] DOB/AGE: [birth year] 20 y.o.  Admit date: 12/11/2016 Discharge date: 12/12/2016  Admission Diagnoses:  Principal Problem:   Thyroid mass   Discharge Diagnoses:  Same  Surgeries: Procedure(s): LEFT HEMI-TYROIDECTOMY on 12/11/2016   Consultants: None  Discharged Condition: Improved  Hospital Course: [name] is an 20 y.o. female who was admitted 12/11/2016 with a diagnosis of Thyroid mass and went to the operating room on 12/11/2016 and underwent the above named procedures.   Left hemi-thyroidectomy. Pt d/c'ed on POD #1 in stable condition.  Recent vital signs:  Vitals:   12/12/16 0242 12/12/16 0508  BP: (!) 99/48 (!) 97/49  Pulse: 90 83  Resp: 18 18  Temp: 98.3 F (36.8 C) 98.2 F (36.8 C)    Recent laboratory studies:  Results for orders placed or performed during the hospital encounter of 12/11/16  CBC  Result Value Ref Range   WBC 12.2 (H) 4.0 - 10.5 K/uL   RBC 4.87 3.87 - 5.11 MIL/uL   Hemoglobin 13.5 12.0 - 15.0 g/dL   HCT 40.0 36.0 - 46.0 %   MCV 82.1 78.0 - 100.0 fL   MCH 27.7 26.0 - 34.0 pg   MCHC 33.8 30.0 - 36.0 g/dL   RDW 13.3 11.5 - 15.5 %   Platelets 278 150 - 400 K/uL  Creatinine, serum  Result Value Ref Range   Creatinine, Ser 0.88 0.44 - 1.00 mg/dL   GFR calc non Af Amer >60 >60 mL/min   GFR calc Af Amer >60 >60 mL/min    Discharge Medications:     Diagnostic Studies: Dg Chest 2 View  Result Date: 12/04/2016 CLINICAL DATA:  Preoperative examination prior to thyroid surgery. No cardiopulmonary symptoms or history. Nonsmoker. EXAM: CHEST  2 VIEW COMPARISON:  None in PACs FINDINGS: The lungs are adequately inflated and clear. The heart and pulmonary vascularity are normal. The mediastinum is normal in width. There is no pleural effusion. The bony thorax exhibits no acute abnormality. IMPRESSION: There is no active cardiopulmonary disease. Electronically Signed   By:  Leili Eskenazi  [place] M.D.   On: 12/04/2016 12:55    Disposition: 01-Home or Self Care  Discharge Instructions    Diet - low sodium heart healthy    Complete by:  As directed    Diet - low sodium heart healthy    Complete by:  As directed    Discharge instructions    Complete by:  As directed    1. Limited activity 2. Liquid and soft diet, advance as tolerated 3. May bathe and shower day after surgery 4. Wound care - Gentle cleaning with soap and water 5. DO NOT APPLY ANY OINTMENT 6. Elevate Head of Bed   Increase activity slowly    Complete by:  As directed    Increase activity slowly    Complete by:  As directed       Follow-up Information    Boby Eyer, MD In 10 days.   Specialty:  Otolaryngology Contact information: [address] [place] [address] [phone]            Signed: Jerrell Belfast 12/12/2016, 8:48 AM

## 2016-12-12 NOTE — Progress Notes (Signed)
Neomia Dear to be D/C'd  per MD order. Discussed with the patient and all questions fully answered.  VSS, Skin clean, dry and intact without evidence of skin break down, no evidence of skin tears noted.  IV catheter discontinued intact. Site without signs and symptoms of complications. Dressing and pressure applied.  An After Visit Summary was printed and given to the patient. Patient received prescription.  D/c education completed with patient/family including follow up instructions, medication list, d/c activities limitations if indicated, with other d/c instructions as indicated by MD - patient able to verbalize understanding, all questions fully answered.   Patient instructed to return to ED, call 911, or call MD for any changes in condition.   Patient to be escorted via family, declined wheelchair service, and D/C home via private auto.

## 2016-12-12 NOTE — Progress Notes (Signed)
   ENT Progress Note: POD #1 s/p Procedure(s): LEFT HEMI-TYROIDECTOMY   Subjective: No pain  Objective: Vital signs in last 24 hours: Temp:  [97.7 F (36.5 C)-98.6 F (37 C)] 98.2 F (36.8 C) (12/30 0508) Pulse Rate:  [83-122] 83 (12/30 0508) Resp:  [15-25] 18 (12/30 0508) BP: (97-129)/(48-80) 97/49 (12/30 0508) SpO2:  [97 %-99 %] 99 % (12/30 0508) Weight:  [113.1 kg (249 lb 6.4 oz)] 113.1 kg (249 lb 6.4 oz) (12/29 0928) Weight change:  Last BM Date: 12/10/16  Intake/Output from previous day: 12/29 0701 - 12/30 0700 In: 1766.3 [P.O.:120; I.V.:1646.3] Out: 61 [Drains:36; Blood:20] Intake/Output this shift: No intake/output data recorded.  Labs:  Recent Labs  12/11/16 1606  WBC 12.2*  HGB 13.5  HCT 40.0  PLT 278   No results for input(s): NA, K, CL, CO2, GLUCOSE, BUN, CALCIUM in the last 72 hours.  Invalid input(s): CREATININR  Studies/Results: No results found.   PHYSICAL EXAM: Inc intact JP removed   Assessment/Plan: Pt stable postop D/C to home     Maria Taylor 12/12/2016, 8:44 AM

## 2016-12-15 NOTE — Op Note (Signed)
NAME:  Maria Taylor, Maria Taylor                  ACCOUNT NO.:  MEDICAL RECORD NO.:  [id number]  LOCATION:                                 FACILITY:  PHYSICIAN:  Early Chars. Wilburn Cornelia, M.D.DATE OF BIRTH:  [birth year]  DATE OF PROCEDURE:  12/11/2016 DATE OF DISCHARGE:                              OPERATIVE REPORT   PREOPERATIVE DIAGNOSIS:  Left thyroid mass.  POSTOPERATIVE DIAGNOSIS:  Left thyroid mass.  INDICATION FOR SURGERY:  Left thyroid mass.  PROCEDURE:  Left hemithyroidectomy.  SURGEON:  Early Chars. Wilburn Cornelia, MD.  ANESTHESIA:  General endotracheal with NIMs.  ASSISTANT:  Jolene Provost, PA.  COMPLICATIONS:  There were no complications.  ESTIMATED BLOOD LOSS:  Less than 50 mL.  DISPOSITION:  The patient transferred from the operating room to the recovery room in stable condition.  BRIEF HISTORY:  The patient is a healthy 21 year old female, who noted a swelling in the lower aspect of the left neck.  She underwent ultrasound which showed a 3 cm solid mass involving the left thyroid lobe.  No evidence of adenopathy or other findings.  The patient underwent fine- needle aspiration of the mass which showed Bethesda III classification and suspicious for possible malignancy.  Afirma was performed and again this was thought to be suspicious for possible malignancy.  Given the patient's history and findings, I recommended left thyroidectomy under general anesthesia with frozen section and possible total thyroidectomy. The risks and benefits of the procedure were discussed in detail with the patient and her family, and they understood and agreed with our plan for surgery which is scheduled on elective basis at [facility] on December 11, 2016.  DESCRIPTION OF PROCEDURE:  The patient was brought to the operating room, placed in supine position on the operating table.  General endotracheal anesthesia was established without difficulty using the Xomed NIMs endotracheal tube for  intraoperative recurrent laryngeal nerve monitoring.  The endotracheal tube was properly positioned and there was good stimulation bilaterally.  The patient then underwent surgical time-out with correct identification of the patient, the surgical procedure, and the left laterality of the proposed thyroidectomy.  The patient was then injected with 2 mL of 1% lidocaine with 1:100,000 solution of epinephrine in a subcutaneous fashion in a horizontal line across the anterior neck in a preexisting skin crease. She was then prepped draped and prepared for surgery.  When the patient was prepared for surgery, she was positioned and a 4 cm curvilinear incision was created in the lower aspect of the anterior neck following a pre-existing skin crease was carried through the skin underlying subcutaneous tissue.  Subcutaneous fatty tissue was dissected to the level of strap muscles.  Strap muscles were lateralized in the midline, was followed to the anterior aspect of the trachea and the anterior compartment of the neck.  There was no palpable adenopathy. The strap muscles were elevated on the patient's right and the right thyroid lobe was palpated.  There was no nodule or mass, and again no evidence of adenopathy in the anterior compartment of the neck. Attention was then turned to the left-hand side, where the strap muscles were elevated and the thyroid lobe and a  large 3 cm firm mass were identified.  The thyroid capsule appeared to be intact.  There was no adhesion to the surrounding structures, and no evidence of extension beyond the capsule.  Using careful blunt and sharp dissection, the inferior aspect of the left thyroid lobe was then carefully dissected. A parathyroid gland was identified and reflected inferiorly with the vascular pedicle.  Dissection was then carried along the lateral aspect of the gland carefully elevating the thyroid gland and nodule anteriorly.  The middle thyroid vein  was divided and suture ligated. Dissection was then carried superiorly and the superior pole of the left thyroid lobe was identified and vascular pedicle was identified.  This divided and suture ligated followed by Harmonic Scalpel cautery.  The isthmus of the thyroid was divided with Bovie electrocautery.  The left side was elevated free from the patient's trachea, then dissecting from lateral to medial along the deep aspect of the left thyroid lobe.  The entire lobe was elevated including the concerning mass which was elevated and resected.  The recurrent laryngeal nerve was identified in the tracheoesophageal groove and was stimulated using the NIMs monitor stimulation at 0.5 milliamps.  The nerve was intact throughout its course.  There was minimal bleeding along the medial attachment of the gland which was managed with bipolar cautery.  A small piece of Surgicel was then placed over the nerve in the tracheoesophageal groove.  A 7- [place] round drain was placed at the depth of the incision and sutured to the left lateral skin.  The wound was then closed in multiple layers. A single stitch was used to reapproximate the strap muscles in the midline with an interrupted 4-0 Vicryl suture.  The subcutaneous tissue was closed with interrupted 3-0 Vicryl.  Immediate subcutaneous tissue closed with interrupted 4-0 Vicryl in a horizontal mattress fashion. The final skin edge was closed with Dermabond surgical glue.  The patient was then awakened from anesthetic.  She was extubated and transferred from the operating room to recovery room in stable condition.  There were no complications.  Estimated blood loss was less than 50 mL.          ______________________________ Early Chars. Wilburn Cornelia, M.D.     DLS/MEDQ  D:  [id number]  T:  12/12/2016  Job:  [id number]

## 2017-01-01 ENCOUNTER — Encounter (HOSPITAL_COMMUNITY): Payer: Self-pay | Admitting: Otolaryngology

## 2017-01-01 NOTE — OR Nursing (Signed)
Late entry for patient entrance time correction.

## 2018-04-08 IMAGING — CR DG CHEST 2V
2 series · 2 of 2 positions shown · non-contrast
Comparison: None in PACs

CLINICAL DATA: Preoperative examination prior to thyroid surgery.
No cardiopulmonary symptoms or history. Nonsmoker.

EXAM:
CHEST  2 VIEW

[w chest pa]
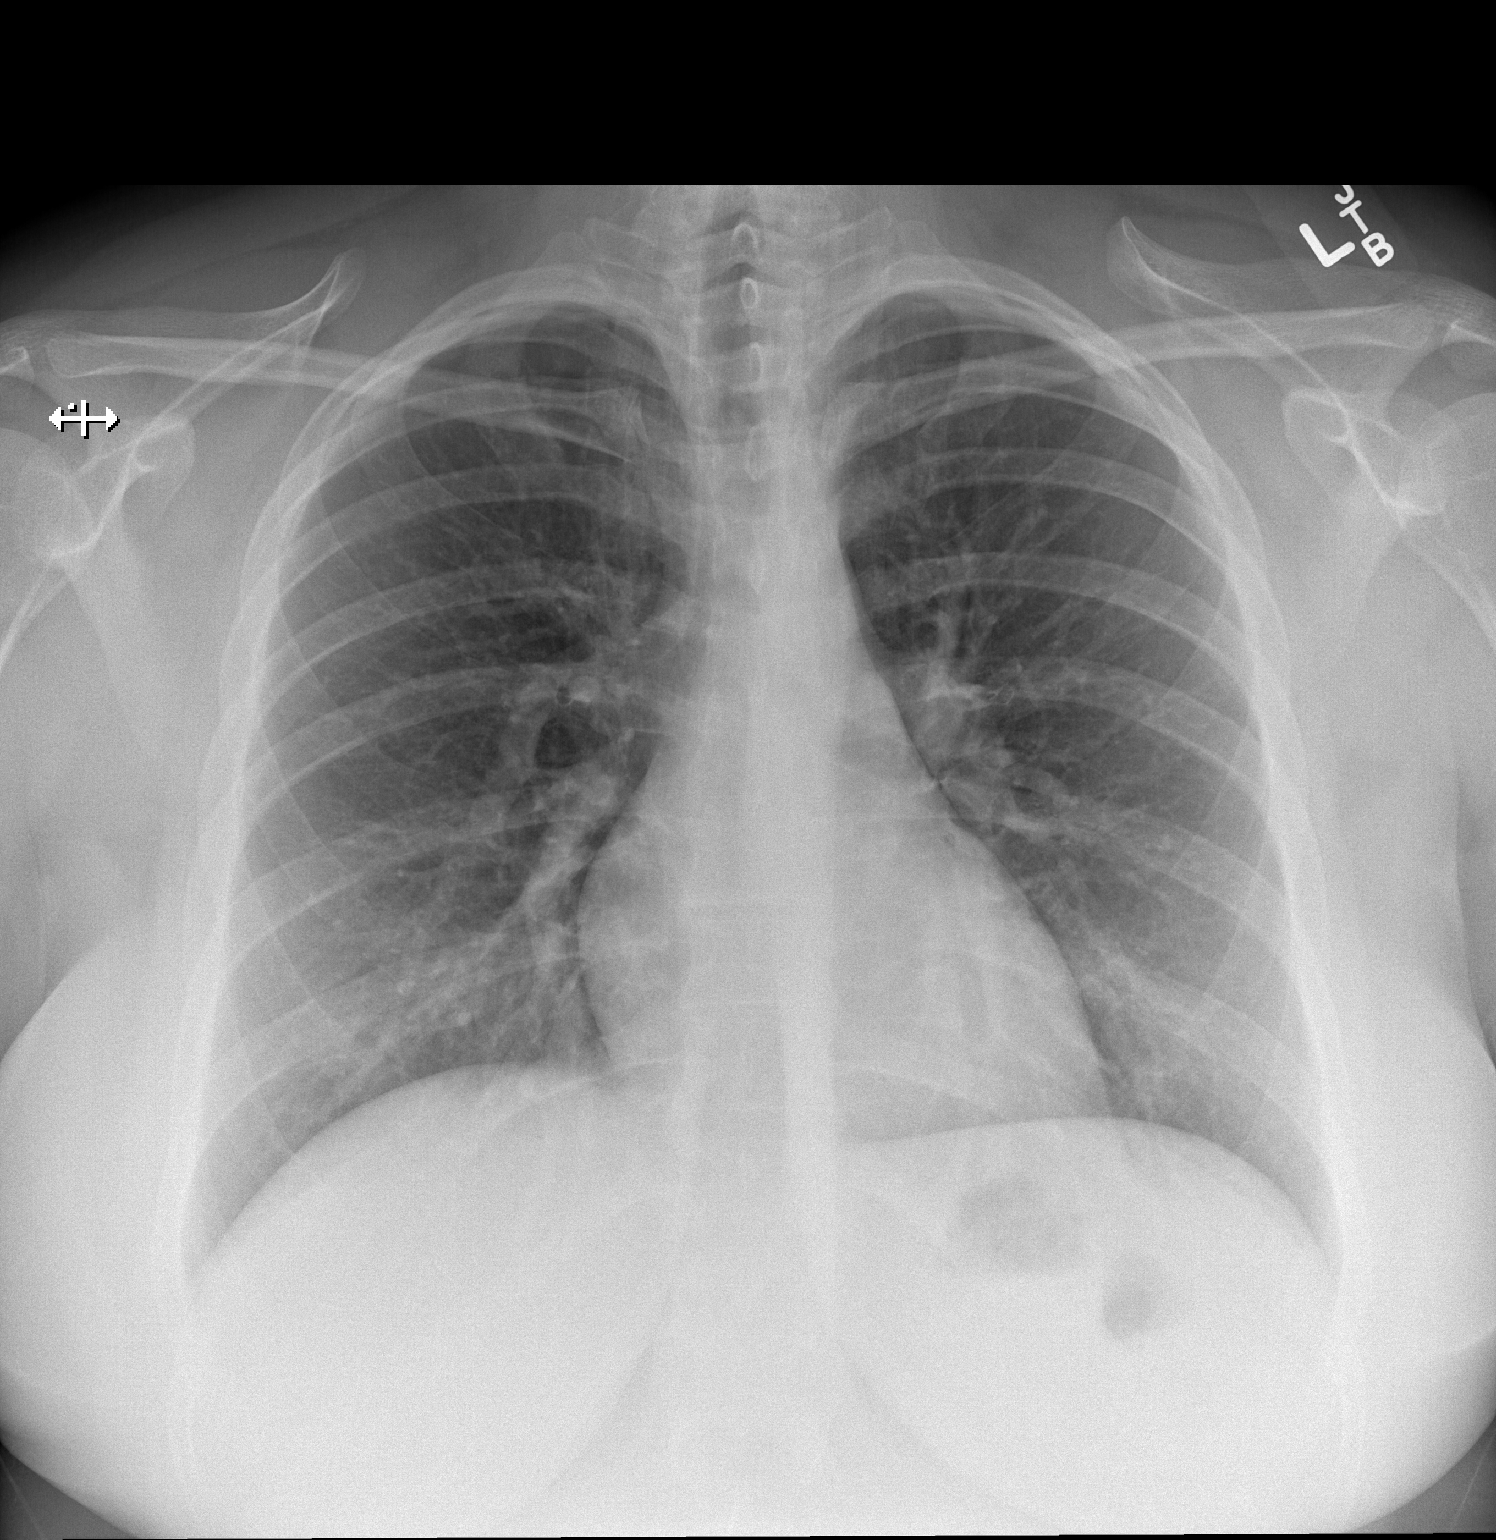

[w chest lat]
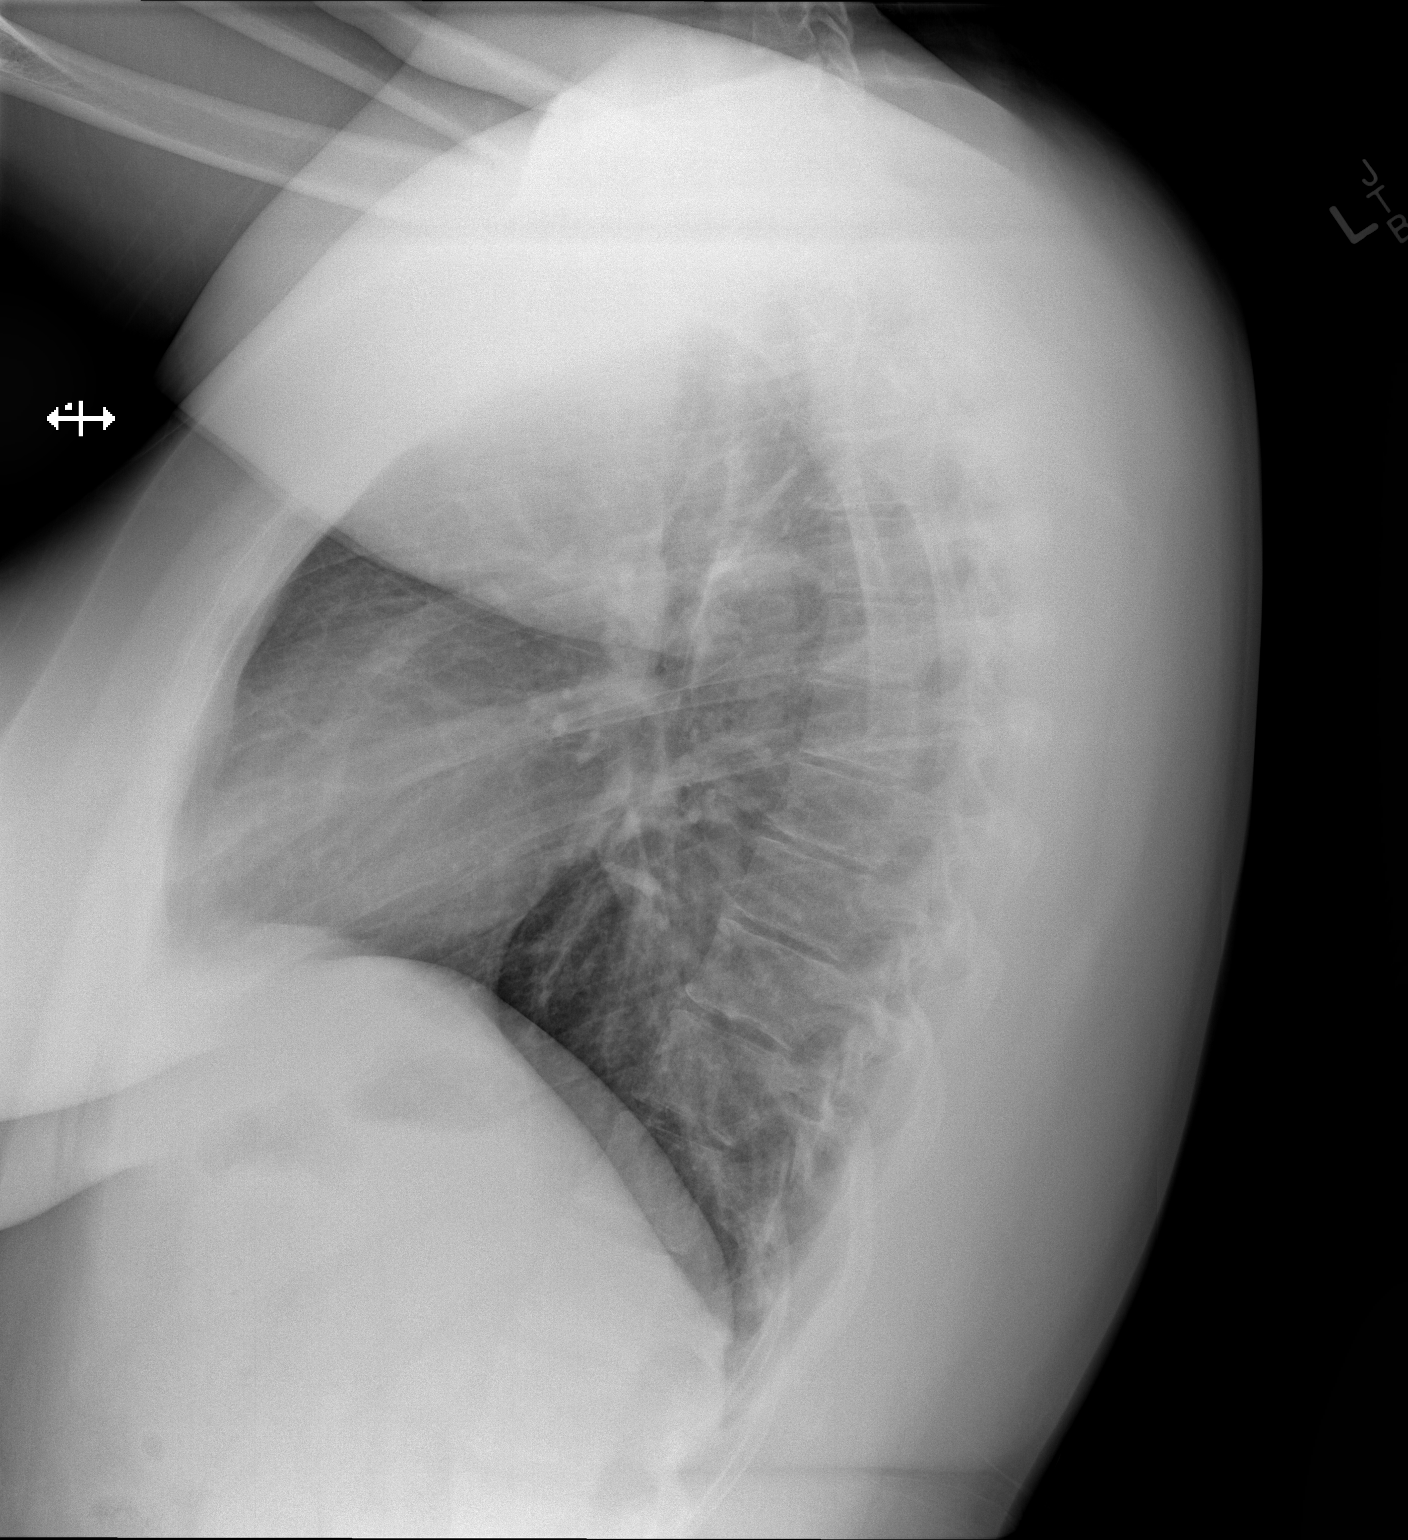

[2 of 2 positions shown; findings below may reference images not displayed]

FINDINGS: The lungs are adequately inflated and clear. The heart and pulmonary
vascularity are normal. The mediastinum is normal in width. There is
no pleural effusion. The bony thorax exhibits no acute abnormality.
IMPRESSION: There is no active cardiopulmonary disease.

## 2018-12-26 ENCOUNTER — Other Ambulatory Visit: Payer: Self-pay | Admitting: Neurosurgery

## 2018-12-26 DIAGNOSIS — R9089 Other abnormal findings on diagnostic imaging of central nervous system: Secondary | ICD-10-CM
